# Patient Record
Sex: Female | Born: 1970 | Race: White | Hispanic: No | Marital: Single | Smoking: Current every day smoker
Health system: Southern US, Community
[De-identification: ages and names within clinical notes are randomized; demographics above are authoritative.]

## PROBLEM LIST (undated history)

## (undated) HISTORY — PX: CHOLECYSTECTOMY: SHX55

---

## 1998-06-30 ENCOUNTER — Ambulatory Visit (HOSPITAL_COMMUNITY): Admission: RE | Admit: 1998-06-30 | Discharge: 1998-06-30 | Payer: Self-pay | Admitting: *Deleted

## 1998-10-07 ENCOUNTER — Ambulatory Visit (HOSPITAL_COMMUNITY): Admission: RE | Admit: 1998-10-07 | Discharge: 1998-10-07 | Payer: Self-pay | Admitting: Obstetrics

## 1998-10-13 ENCOUNTER — Inpatient Hospital Stay (HOSPITAL_COMMUNITY): Admission: AD | Admit: 1998-10-13 | Discharge: 1998-10-15 | Payer: Self-pay | Admitting: *Deleted

## 2000-02-04 ENCOUNTER — Inpatient Hospital Stay (HOSPITAL_COMMUNITY): Admission: AD | Admit: 2000-02-04 | Discharge: 2000-02-04 | Payer: Self-pay | Admitting: *Deleted

## 2000-09-14 ENCOUNTER — Encounter: Payer: Self-pay | Admitting: *Deleted

## 2000-09-14 ENCOUNTER — Ambulatory Visit (HOSPITAL_COMMUNITY): Admission: RE | Admit: 2000-09-14 | Discharge: 2000-09-14 | Payer: Self-pay | Admitting: *Deleted

## 2000-10-07 ENCOUNTER — Inpatient Hospital Stay (HOSPITAL_COMMUNITY): Admission: AD | Admit: 2000-10-07 | Discharge: 2000-10-10 | Payer: Self-pay | Admitting: *Deleted

## 2000-12-18 ENCOUNTER — Inpatient Hospital Stay (HOSPITAL_COMMUNITY): Admission: AD | Admit: 2000-12-18 | Discharge: 2000-12-23 | Payer: Self-pay | Admitting: *Deleted

## 2000-12-19 ENCOUNTER — Encounter: Payer: Self-pay | Admitting: Obstetrics

## 2001-12-26 ENCOUNTER — Encounter: Payer: Self-pay | Admitting: Emergency Medicine

## 2001-12-26 ENCOUNTER — Emergency Department (HOSPITAL_COMMUNITY): Admission: EM | Admit: 2001-12-26 | Discharge: 2001-12-26 | Payer: Self-pay | Admitting: Emergency Medicine

## 2001-12-31 ENCOUNTER — Emergency Department (HOSPITAL_COMMUNITY): Admission: EM | Admit: 2001-12-31 | Discharge: 2001-12-31 | Payer: Self-pay | Admitting: Emergency Medicine

## 2002-01-23 ENCOUNTER — Encounter: Payer: Self-pay | Admitting: *Deleted

## 2002-01-23 ENCOUNTER — Encounter: Admission: RE | Admit: 2002-01-23 | Discharge: 2002-01-23 | Payer: Self-pay | Admitting: *Deleted

## 2002-01-28 ENCOUNTER — Ambulatory Visit (HOSPITAL_COMMUNITY): Admission: RE | Admit: 2002-01-28 | Discharge: 2002-01-29 | Payer: Self-pay | Admitting: *Deleted

## 2002-02-04 ENCOUNTER — Encounter: Payer: Self-pay | Admitting: Emergency Medicine

## 2002-02-04 ENCOUNTER — Inpatient Hospital Stay (HOSPITAL_COMMUNITY): Admission: EM | Admit: 2002-02-04 | Discharge: 2002-02-07 | Payer: Self-pay | Admitting: Emergency Medicine

## 2002-02-04 ENCOUNTER — Encounter: Payer: Self-pay | Admitting: General Surgery

## 2002-02-05 ENCOUNTER — Encounter: Payer: Self-pay | Admitting: Internal Medicine

## 2002-02-09 ENCOUNTER — Inpatient Hospital Stay (HOSPITAL_COMMUNITY): Admission: EM | Admit: 2002-02-09 | Discharge: 2002-02-12 | Payer: Self-pay | Admitting: *Deleted

## 2002-03-28 ENCOUNTER — Encounter: Payer: Self-pay | Admitting: Internal Medicine

## 2002-03-28 ENCOUNTER — Ambulatory Visit (HOSPITAL_COMMUNITY): Admission: RE | Admit: 2002-03-28 | Discharge: 2002-03-28 | Payer: Self-pay | Admitting: Internal Medicine

## 2003-01-24 ENCOUNTER — Emergency Department (HOSPITAL_COMMUNITY): Admission: EM | Admit: 2003-01-24 | Discharge: 2003-01-24 | Payer: Self-pay | Admitting: Emergency Medicine

## 2003-01-25 ENCOUNTER — Emergency Department (HOSPITAL_COMMUNITY): Admission: EM | Admit: 2003-01-25 | Discharge: 2003-01-25 | Payer: Self-pay | Admitting: Emergency Medicine

## 2004-12-11 ENCOUNTER — Emergency Department (HOSPITAL_COMMUNITY): Admission: EM | Admit: 2004-12-11 | Discharge: 2004-12-11 | Payer: Self-pay | Admitting: Emergency Medicine

## 2005-02-19 ENCOUNTER — Emergency Department (HOSPITAL_COMMUNITY): Admission: EM | Admit: 2005-02-19 | Discharge: 2005-02-19 | Payer: Self-pay | Admitting: Emergency Medicine

## 2006-09-25 ENCOUNTER — Emergency Department (HOSPITAL_COMMUNITY): Admission: EM | Admit: 2006-09-25 | Discharge: 2006-09-25 | Payer: Self-pay | Admitting: Emergency Medicine

## 2014-07-14 ENCOUNTER — Emergency Department (HOSPITAL_COMMUNITY)
Admission: EM | Admit: 2014-07-14 | Discharge: 2014-07-15 | Disposition: A | Discharge status: Home / Self Care | Payer: Self-pay | Attending: Emergency Medicine | Admitting: Emergency Medicine

## 2014-07-14 ENCOUNTER — Emergency Department (HOSPITAL_COMMUNITY): Payer: Self-pay

## 2014-07-14 ENCOUNTER — Other Ambulatory Visit: Payer: Self-pay

## 2014-07-14 ENCOUNTER — Encounter (HOSPITAL_COMMUNITY): Payer: Self-pay | Admitting: Emergency Medicine

## 2014-07-14 DIAGNOSIS — R0602 Shortness of breath: Secondary | ICD-10-CM | POA: Insufficient documentation

## 2014-07-14 DIAGNOSIS — Z720 Tobacco use: Secondary | ICD-10-CM | POA: Insufficient documentation

## 2014-07-14 DIAGNOSIS — R42 Dizziness and giddiness: Secondary | ICD-10-CM | POA: Insufficient documentation

## 2014-07-14 DIAGNOSIS — R079 Chest pain, unspecified: Secondary | ICD-10-CM | POA: Insufficient documentation

## 2014-07-14 DIAGNOSIS — Z7982 Long term (current) use of aspirin: Secondary | ICD-10-CM | POA: Insufficient documentation

## 2014-07-14 DIAGNOSIS — R531 Weakness: Secondary | ICD-10-CM | POA: Insufficient documentation

## 2014-07-14 LAB — COMPREHENSIVE METABOLIC PANEL
ALT: 15 U/L (ref 0–35)
ANION GAP: 9 (ref 5–15)
AST: 21 U/L (ref 0–37)
Albumin: 4.3 g/dL (ref 3.5–5.2)
Alkaline Phosphatase: 73 U/L (ref 39–117)
BILIRUBIN TOTAL: 0.1 mg/dL — AB (ref 0.3–1.2)
BUN: 11 mg/dL (ref 6–23)
CHLORIDE: 103 mmol/L (ref 96–112)
CO2: 25 mmol/L (ref 19–32)
CREATININE: 0.81 mg/dL (ref 0.50–1.10)
Calcium: 9.7 mg/dL (ref 8.4–10.5)
GFR calc Af Amer: >90 mL/min (ref >90)
GFR calc non Af Amer: 89 mL/min — ABNORMAL LOW (ref >90)
Glucose, Bld: 93 mg/dL (ref 70–99)
Potassium: 4.3 mmol/L (ref 3.5–5.1)
SODIUM: 137 mmol/L (ref 135–145)
Total Protein: 8.1 g/dL (ref 6.0–8.3)

## 2014-07-14 LAB — CBC
HCT: 34.1 % — ABNORMAL LOW (ref 36.0–46.0)
Hemoglobin: 11.2 g/dL — ABNORMAL LOW (ref 12.0–15.0)
MCH: 26.8 pg (ref 26.0–34.0)
MCHC: 32.8 g/dL (ref 30.0–36.0)
MCV: 81.6 fL (ref 78.0–100.0)
Platelets: 362 10*3/uL (ref 150–400)
RBC: 4.18 MIL/uL (ref 3.87–5.11)
RDW: 14.6 % (ref 11.5–15.5)
WBC: 7.1 10*3/uL (ref 4.0–10.5)

## 2014-07-14 LAB — I-STAT TROPONIN, ED
TROPONIN I, POC: 0 ng/mL (ref 0.00–0.08)
Troponin i, poc: 0 ng/mL (ref 0.00–0.08)

## 2014-07-14 LAB — LIPASE, BLOOD: Lipase: 31 U/L (ref 11–59)

## 2014-07-14 NOTE — ED Notes (Signed)
Pt states the chest pain has resolved.

## 2014-07-14 NOTE — ED Provider Notes (Signed)
CSN: [id number]     Arrival date & time 07/14/14  1840 History   First MD Initiated Contact with Patient 07/14/14 2151     Chief Complaint  Patient presents with  . Chest Pain     (Consider location/radiation/quality/duration/timing/severity/associated sxs/prior Treatment) HPI Karen Howell is a 44 y.o. female with no medical problems presents to emergency department complaining of chest pain. Patient states she was napping, woke up this afternoon around 2:30 PM with severe chest tightness. She states her pain was "tightness and some sharp pain like someone stabbing me in the chest." She states pain radiated to the left shoulder and is having pain and cramping sensation in the left trapezius. Patient does admit that the trapezius pain has been there for several days. She admits to associated shortness of breath which lasts about an hour, admits to dizziness. States also had some nausea. Denies vomiting. States she has never had similar pain in the past. She states she has had panic attacks in the past but states this felt different. She called in urgent care but was told that she needs to come to emergency department if she is having pain. Patient states her pain resolved in the waiting room. She no longer is having any chest pain. She still having pain in her left shoulder which is worse with movement and palpation of her trapezius muscle. Patient states she took aspirin at home prior to coming in. She denies any history of hypertension, diabetes, high cholesterol. She denies any immediate family history of cardiac problems. He denies any cocaine use. No cough, congestion, injuries.   History reviewed. No pertinent past medical history. Past Surgical History  Procedure Laterality Date  . Cholecystectomy     No family history on file. History  Substance Use Topics  . Smoking status: Current Every Day Smoker  . Smokeless tobacco: Not on file  . Alcohol Use: No   OB History    No data  available     Review of Systems  Constitutional: Negative for fever and chills.  Respiratory: Positive for chest tightness and shortness of breath. Negative for cough.   Cardiovascular: Positive for chest pain. Negative for palpitations and leg swelling.  Gastrointestinal: Negative for nausea, vomiting, abdominal pain and diarrhea.  Genitourinary: Negative for dysuria.  Musculoskeletal: Negative for myalgias, arthralgias, neck pain and neck stiffness.  Skin: Negative for rash.  Neurological: Positive for dizziness, weakness and light-headedness. Negative for headaches.  All other systems reviewed and are negative.     Allergies  Review of patient's allergies indicates no known allergies.  Home Medications   Prior to Admission medications   Medication Sig Start Date End Date Taking? Authorizing Provider  acetaminophen (TYLENOL) 500 MG tablet Take 500 mg by mouth every 6 (six) hours as needed for mild pain.   Yes Historical Provider, MD  aspirin 325 MG tablet Take 325 mg by mouth every 4 (four) hours as needed for mild pain.   Yes Historical Provider, MD  LORazepam (ATIVAN) 0.5 MG tablet Take 0.5 mg by mouth every 6 (six) hours as needed for anxiety.   Yes Historical Provider, MD   BP 103/47 mmHg  Pulse 58  Temp(Src) 98.4 F (36.9 C) (Oral)  Resp 22  Ht 5\' 8"  (1.727 m)  Wt 132 lb (59.875 kg)  BMI 20.08 kg/m2  SpO2 100%  LMP 06/29/2014 Physical Exam  Constitutional: She is oriented to person, place, and time. She appears well-developed and well-nourished. No distress.  HENT:  Head: Normocephalic.  Eyes: Conjunctivae are normal.  Neck: Neck supple.  Left trapezius tenderness  Cardiovascular: Normal rate, regular rhythm and normal heart sounds.   Pulmonary/Chest: Effort normal and breath sounds normal. No respiratory distress. She has no wheezes. She has no rales. She exhibits tenderness.  Tender over lower sternum and epigastric area  Abdominal: Soft. Bowel sounds are  normal. She exhibits no distension. There is no tenderness. There is no rebound.  Musculoskeletal: She exhibits no edema.  Neurological: She is alert and oriented to person, place, and time. No cranial nerve deficit. Coordination normal.  Skin: Skin is warm and dry.  Psychiatric: She has a normal mood and affect. Her behavior is normal.  Nursing note and vitals reviewed.   ED Course  Procedures (including critical care time) Labs Review Labs Reviewed  CBC - Abnormal; Notable for the following:    Hemoglobin 11.2 (*)    HCT 34.1 (*)    All other components within normal limits  COMPREHENSIVE METABOLIC PANEL - Abnormal; Notable for the following:    Total Bilirubin 0.1 (*)    GFR calc non Af Amer 89 (*)    All other components within normal limits  LIPASE, BLOOD  I-STAT TROPOININ, ED  I-STAT TROPOININ, ED    Imaging Review Dg Chest 2 View  07/14/2014   CLINICAL DATA:  Left-sided chest pain for 2 days.  Smoker.  EXAM: CHEST  2 VIEW  COMPARISON:  None.  FINDINGS: Mild hyperinflation. The heart size and mediastinal contours are within normal limits. Both lungs are clear. The visualized skeletal structures are unremarkable.  IMPRESSION: No active cardiopulmonary disease.   Electronically Signed   By: Burman Nieves M.D.   On: 07/14/2014 19:15     EKG Interpretation None      MDM   Final diagnoses:  Chest pain, unspecified chest pain type    Pt with sudden onset of chest pain and left shoulder pain while taking a nap. Pt has no risk factors for CAD. She has no cardiac hx. No risk factors for PE. VS normal. Will check labs, CXR.   12:04 AM Labs unremarkable. Delta trop normal. Pt now admits to drinking dr. Reino Kent just before napping and states sometimes it can give her GERD. She is still symptom free. Home with outpatient follow up. Return precautions discussed. Will add flexeril.   Filed Vitals:   07/14/14 2200 07/14/14 2215 07/14/14 2230 07/14/14 2245  BP: 103/47 90/42  91/49 97/60  Pulse: 58 61 60 59  Temp:      TempSrc:      Resp: Height:      Weight:      SpO2: 100% 99% 98% 100%       Lottie Mussel, PA-C 07/15/14 0024  Mirian Mo, MD 07/17/14 0021

## 2014-07-14 NOTE — ED Notes (Signed)
Pt reports waking up with chest tightness this morning, denies SOB or NV.  Pt also c/o of bilateral shoulder and neck pain, denies injury.  Respirations e/u, no acute distress noted.

## 2014-07-15 MED ORDER — CYCLOBENZAPRINE HCL 10 MG PO TABS: 10.0000 mg | ORAL_TABLET | Freq: Two times a day (BID) | ORAL | Status: DC | PRN
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
## 2014-07-15 NOTE — Discharge Instructions (Signed)
Flexeril for spasms. Lab work looks normal today. Follow up with your doctor or urgent care for further evaluation and treatment.    Chest Pain (Nonspecific) It is often hard to give a specific diagnosis for the cause of chest pain. There is always a chance that your pain could be related to something serious, such as a heart attack or a blood clot in the lungs. You need to follow up with your health care provider for further evaluation. CAUSES   Heartburn.  Pneumonia or bronchitis.  Anxiety or stress.  Inflammation around your heart (pericarditis) or lung (pleuritis or pleurisy).  A blood clot in the lung.  A collapsed lung (pneumothorax). It can develop suddenly on its own (spontaneous pneumothorax) or from trauma to the chest.  Shingles infection (herpes zoster virus). The chest wall is composed of bones, muscles, and cartilage. Any of these can be the source of the pain.  The bones can be bruised by injury.  The muscles or cartilage can be strained by coughing or overwork.  The cartilage can be affected by inflammation and become sore (costochondritis). DIAGNOSIS  Lab tests or other studies may be needed to find the cause of your pain. Your health care provider may have you take a test called an ambulatory electrocardiogram (ECG). An ECG records your heartbeat patterns over a 24-hour period. You may also have other tests, such as:  Transthoracic echocardiogram (TTE). During echocardiography, sound waves are used to evaluate how blood flows through your heart.  Transesophageal echocardiogram (TEE).  Cardiac monitoring. This allows your health care provider to monitor your heart rate and rhythm in real time.  Holter monitor. This is a portable device that records your heartbeat and can help diagnose heart arrhythmias. It allows your health care provider to track your heart activity for several days, if needed.  Stress tests by exercise or by giving medicine that makes the  heart beat faster. TREATMENT   Treatment depends on what may be causing your chest pain. Treatment may include:  Acid blockers for heartburn.  Anti-inflammatory medicine.  Pain medicine for inflammatory conditions.  Antibiotics if an infection is present.  You may be advised to change lifestyle habits. This includes stopping smoking and avoiding alcohol, caffeine, and chocolate.  You may be advised to keep your head raised (elevated) when sleeping. This reduces the chance of acid going backward from your stomach into your esophagus. Most of the time, nonspecific chest pain will improve within 2-3 days with rest and mild pain medicine.  HOME CARE INSTRUCTIONS   If antibiotics were prescribed, take them as directed. Finish them even if you start to feel better.  For the next few days, avoid physical activities that bring on chest pain. Continue physical activities as directed.  Do not use any tobacco products, including cigarettes, chewing tobacco, or electronic cigarettes.  Avoid drinking alcohol.  Only take medicine as directed by your health care provider.  Follow your health care provider's suggestions for further testing if your chest pain does not go away.  Keep any follow-up appointments you made. If you do not go to an appointment, you could develop lasting (chronic) problems with pain. If there is any problem keeping an appointment, call to reschedule. SEEK MEDICAL CARE IF:   Your chest pain does not go away, even after treatment.  You have a rash with blisters on your chest.  You have a fever. SEEK IMMEDIATE MEDICAL CARE IF:   You have increased chest pain or pain that  spreads to your arm, neck, jaw, back, or abdomen.  You have shortness of breath.  You have an increasing cough, or you cough up blood.  You have severe back or abdominal pain.  You feel nauseous or vomit.  You have severe weakness.  You faint.  You have chills. This is an emergency. Do  not wait to see if the pain will go away. Get medical help at once. Call your local emergency services (911 in [place] Do not drive yourself to the hospital. MAKE SURE YOU:   Understand these instructions.  Will watch your condition.  Will get help right away if you are not doing well or get worse. Document Released: 03/02/2005 Document Revised: 05/28/2013 Document Reviewed: 12/27/2007 [facility] Patient Information 2015 [place], [place]. This information is not intended to replace advice given to you by your health care provider. Make sure you discuss any questions you have with your health care provider.

## 2014-08-10 ENCOUNTER — Emergency Department (HOSPITAL_COMMUNITY): Payer: No Typology Code available for payment source

## 2014-08-10 ENCOUNTER — Emergency Department (HOSPITAL_COMMUNITY)
Admission: EM | Admit: 2014-08-10 | Discharge: 2014-08-11 | Disposition: A | Discharge status: Home / Self Care | Payer: No Typology Code available for payment source | Attending: Emergency Medicine | Admitting: Emergency Medicine

## 2014-08-10 ENCOUNTER — Encounter (HOSPITAL_COMMUNITY): Payer: Self-pay | Admitting: Emergency Medicine

## 2014-08-10 DIAGNOSIS — S0990XA Unspecified injury of head, initial encounter: Secondary | ICD-10-CM | POA: Diagnosis not present

## 2014-08-10 DIAGNOSIS — Y998 Other external cause status: Secondary | ICD-10-CM | POA: Insufficient documentation

## 2014-08-10 DIAGNOSIS — Z720 Tobacco use: Secondary | ICD-10-CM | POA: Insufficient documentation

## 2014-08-10 DIAGNOSIS — S199XXA Unspecified injury of neck, initial encounter: Secondary | ICD-10-CM | POA: Diagnosis present

## 2014-08-10 DIAGNOSIS — S134XXA Sprain of ligaments of cervical spine, initial encounter: Secondary | ICD-10-CM | POA: Diagnosis not present

## 2014-08-10 DIAGNOSIS — Z79899 Other long term (current) drug therapy: Secondary | ICD-10-CM | POA: Diagnosis not present

## 2014-08-10 DIAGNOSIS — Z7982 Long term (current) use of aspirin: Secondary | ICD-10-CM | POA: Diagnosis not present

## 2014-08-10 DIAGNOSIS — Y92410 Unspecified street and highway as the place of occurrence of the external cause: Secondary | ICD-10-CM | POA: Insufficient documentation

## 2014-08-10 DIAGNOSIS — R52 Pain, unspecified: Secondary | ICD-10-CM

## 2014-08-10 DIAGNOSIS — Y9389 Activity, other specified: Secondary | ICD-10-CM | POA: Diagnosis not present

## 2014-08-10 DIAGNOSIS — S139XXA Sprain of joints and ligaments of unspecified parts of neck, initial encounter: Secondary | ICD-10-CM

## 2014-08-10 MED ORDER — CYCLOBENZAPRINE HCL 10 MG PO TABS
5.0000 mg | ORAL_TABLET | Freq: Once | ORAL | Status: AC
  Administered 2014-08-10: 5 mg via ORAL
  Filled 2014-08-10: qty 1

## 2014-08-10 MED ORDER — OXYCODONE HCL 5 MG PO TABS
5.0000 mg | ORAL_TABLET | ORAL | Status: AC
  Administered 2014-08-10: 5 mg via ORAL
  Filled 2014-08-10: qty 1
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
## 2014-08-11 ENCOUNTER — Emergency Department (HOSPITAL_COMMUNITY): Payer: No Typology Code available for payment source

## 2014-08-11 ENCOUNTER — Emergency Department (HOSPITAL_COMMUNITY): Payer: Self-pay

## 2014-08-11 ENCOUNTER — Emergency Department (HOSPITAL_COMMUNITY)
Admission: EM | Admit: 2014-08-11 | Discharge: 2014-08-12 | Disposition: A | Discharge status: Home / Self Care | Payer: Self-pay | Attending: Emergency Medicine | Admitting: Emergency Medicine

## 2014-08-11 ENCOUNTER — Encounter (HOSPITAL_COMMUNITY): Payer: Self-pay | Admitting: *Deleted

## 2014-08-11 ENCOUNTER — Ambulatory Visit (HOSPITAL_COMMUNITY)
Admission: RE | Admit: 2014-08-11 | Discharge: 2014-08-11 | Disposition: A | Discharge status: Home / Self Care | Payer: No Typology Code available for payment source | Source: Ambulatory Visit | Attending: Emergency Medicine | Admitting: Emergency Medicine

## 2014-08-11 DIAGNOSIS — M542 Cervicalgia: Secondary | ICD-10-CM | POA: Insufficient documentation

## 2014-08-11 DIAGNOSIS — Z7982 Long term (current) use of aspirin: Secondary | ICD-10-CM | POA: Insufficient documentation

## 2014-08-11 DIAGNOSIS — Z79899 Other long term (current) drug therapy: Secondary | ICD-10-CM | POA: Insufficient documentation

## 2014-08-11 DIAGNOSIS — I7774 Dissection of vertebral artery: Secondary | ICD-10-CM | POA: Insufficient documentation

## 2014-08-11 DIAGNOSIS — Z720 Tobacco use: Secondary | ICD-10-CM | POA: Insufficient documentation

## 2014-08-11 LAB — I-STAT CHEM 8, ED
BUN: 12 mg/dL (ref 6–23)
Calcium, Ion: 1.23 mmol/L (ref 1.12–1.23)
Chloride: 105 mmol/L (ref 96–112)
Creatinine, Ser: 0.9 mg/dL (ref 0.50–1.10)
Glucose, Bld: 87 mg/dL (ref 70–99)
HEMATOCRIT: 38 % (ref 36.0–46.0)
Hemoglobin: 12.9 g/dL (ref 12.0–15.0)
POTASSIUM: 4 mmol/L (ref 3.5–5.1)
Sodium: 140 mmol/L (ref 135–145)
TCO2: 22 mmol/L (ref 0–100)

## 2014-08-11 MED ORDER — IOHEXOL 350 MG/ML SOLN
100.0000 mL | Freq: Once | INTRAVENOUS | Status: AC | PRN
  Administered 2014-08-11: 100 mL via INTRAVENOUS

## 2014-08-11 MED ORDER — DIPHENHYDRAMINE HCL 50 MG/ML IJ SOLN
25.0000 mg | Freq: Once | INTRAMUSCULAR | Status: DC
  Filled 2014-08-11: qty 1

## 2014-08-11 MED ORDER — OXYCODONE HCL 5 MG PO TABS: 5.0000 mg | ORAL_TABLET | ORAL | Status: AC | PRN

## 2014-08-11 NOTE — Discharge Instructions (Signed)
Cervical Collar A cervical collar is used to hold the head and neck still. This may be a soft, thick collar or a more rigid hard collar. This can keep your sore neck muscles from hurting. The collar also protects you in case a more serious injury of the neck is present and can not be seen yet on an x-ray. FOLLOW THESE INSTRUCTIONS FOR COLLAR USE:  Attach the Velcro tab in back.  Make it tight enough to support part of the weight of your chin.  Do not make it so tight that it hurts or makes it hard to breathe.  Wear it until you are comfortable without it or as instructed. HOME CARE INSTRUCTIONS   Ice packs to the neck or areas of pain approximately 03-04 times a day for 15-20 minutes while awake. Do this for 2 days. Ask your physician if you may remove the cervical collar for bathing or ice.  Do not remove any collar unless instructed by a caregiver. Ask if the collar may be removed for showering or eating.  Only take over-the-counter or prescription medicines for pain, discomfort, or fever as directed by your caregiver.  Do not drive a car until given permission by your caregiver.  Follow all instructions for follow-up with your caregiver. This includes any referrals, physical therapy, and rehabilitation. Any delay in obtaining necessary care could result in a delay or failure of the injury to heal properly. SEEK IMMEDIATE MEDICAL CARE IF:   You develop increasing pain.  You develop problems using your arms or legs or have tingling or other funny feelings in them.  You develop loss of strength in either of your arms or legs or have difficulty walking.  You develop a fever or shaking chills.  You lose control of your bowels or bladder. MAKE SURE YOU:   Understand these instructions.  Will watch your condition.  Will get help right away if you are not doing well or get worse. Document Released: 02/13/2004 Document Revised: 08/15/2011 Document Reviewed: 01/14/2008 [facility]  Patient Information 2015 [place], [place]. This information is not intended to replace advice given to you by your health care provider. Make sure you discuss any questions you have with your health care provider. You have an injury to the ligaments of your neck.  This needs to be further evaluated with the MRI that has been ordered for tomorrow.  If you do not hear from radiology.  By 10 AM please call them also been referred to Dr. Yetta BarreJones, neurosurgery, please call tomorrow to make an appointment. You've been placed in a Aspen collar to help support her neck.  This is to be worn at all times except while bathing.  If you develop new or worsening symptoms such as numbness and tingling or loss of function of an extremity.  Please return immediately to the emergency department for evaluation

## 2014-08-11 NOTE — ED Provider Notes (Signed)
Called by radiology regarding MRI finding.  Cannot exclude a vertebral artery dissection.  Ct angio of the neck was recommended.  We will contact patient and ask her to return to have that study.  Linwood DibblesJon Alisan Dokes, MD 08/11/14 2016

## 2014-08-11 NOTE — ED Notes (Signed)
Pt reports she received a call tonight stating she needs to come to the ED d/t an abnormal MRI that was done earlier today.  Pt reports neck pain.

## 2014-08-12 MED ORDER — METHOCARBAMOL 750 MG PO TABS: 750.0000 mg | ORAL_TABLET | Freq: Four times a day (QID) | ORAL | Status: AC | PRN

## 2014-08-12 MED ORDER — ASPIRIN 81 MG PO CHEW: 324.0000 mg | CHEWABLE_TABLET | Freq: Every day | ORAL | Status: AC

## 2014-08-12 MED ORDER — ASPIRIN EC 325 MG PO TBEC
325.0000 mg | DELAYED_RELEASE_TABLET | Freq: Every day | ORAL | Status: DC
  Administered 2014-08-12: 325 mg via ORAL
  Filled 2014-08-12: qty 1

## 2014-08-12 NOTE — Discharge Instructions (Signed)
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  It is recommended that you take a daily aspirin for this condition.  Follow-up with your primary care doctor.  Return to the emergency department for worsening condition or new concerning symptoms.    Cervical Sprain A cervical sprain is an injury in the neck in which the strong, fibrous tissues (ligaments) that connect your neck bones stretch or tear. Cervical sprains can range from mild to severe. Severe cervical sprains can cause the neck vertebrae to be unstable. This can lead to damage of the spinal cord and can result in serious nervous system problems. The amount of time it takes for a cervical sprain to get better depends on the cause and extent of the injury. Most cervical sprains heal in 1 to 3 weeks. CAUSES  Severe cervical sprains may be caused by:   Contact sport injuries (such as from football, rugby, wrestling, hockey, auto racing, gymnastics, diving, martial arts, or boxing).   Motor vehicle collisions.   Whiplash injuries. This is an injury from a sudden forward and backward whipping movement of the head and neck.  Falls.  Mild cervical sprains may be caused by:   Being in an awkward position, such as while cradling a telephone between your ear and shoulder.   Sitting in a chair that does not offer proper support.   Working at a poorly Marketing executivedesigned computer station.   Looking up or down for long periods of time.  SYMPTOMS   Pain, soreness, stiffness, or a burning sensation in the front, back, or sides of the neck. This discomfort may develop immediately after the injury or slowly, 24 hours or more after the injury.   Pain or tenderness directly in the middle of the back of the neck.   Shoulder or upper back pain.   Limited ability to move the neck.   Headache.   Dizziness.   Weakness, numbness, or tingling in the hands or arms.    Muscle spasms.   Difficulty swallowing or chewing.   Tenderness and swelling of the neck.  DIAGNOSIS  Most of the time your health care provider can diagnose a cervical sprain by taking your history and doing a physical exam. Your health care provider will ask about previous neck injuries and any known neck problems, such as arthritis in the neck. X-rays may be taken to find out if there are any other problems, such as with the bones of the neck. Other tests, such as a CT scan or MRI, may also be needed.  TREATMENT  Treatment depends on the severity of the cervical sprain. Mild sprains can be treated with rest, keeping the neck in place (immobilization), and pain medicines. Severe cervical sprains are immediately immobilized. Further treatment is done to help with pain, muscle spasms, and other symptoms and may include:  Medicines, such as pain relievers, numbing medicines, or muscle relaxants.   Physical therapy. This may involve stretching exercises, strengthening exercises, and posture training. Exercises and improved posture can help stabilize the neck, strengthen muscles, and help stop symptoms from returning.  HOME CARE INSTRUCTIONS   Put ice on the injured area.   Put ice in a plastic bag.   Place a towel between your skin and the bag.   Leave the ice on for 15-20 minutes, 3-4 times a day.   If your injury was severe, you may have been given a cervical collar to wear.  A cervical collar is a two-piece collar designed to keep your neck from moving while it heals.  Do not remove the collar unless instructed by your health care provider.  If you have long hair, keep it outside of the collar.  Ask your health care provider before making any adjustments to your collar. Minor adjustments may be required over time to improve comfort and reduce pressure on your chin or on the back of your head.  Ifyou are allowed to remove the collar for cleaning or bathing, follow your  health care provider's instructions on how to do so safely.  Keep your collar clean by wiping it with mild soap and water and drying it completely. If the collar you have been given includes removable pads, remove them every 1-2 days and hand wash them with soap and water. Allow them to air dry. They should be completely dry before you wear them in the collar.  If you are allowed to remove the collar for cleaning and bathing, wash and dry the skin of your neck. Check your skin for irritation or sores. If you see any, tell your health care provider.  Do not drive while wearing the collar.   Only take over-the-counter or prescription medicines for pain, discomfort, or fever as directed by your health care provider.   Keep all follow-up appointments as directed by your health care provider.   Keep all physical therapy appointments as directed by your health care provider.   Make any needed adjustments to your workstation to promote good posture.   Avoid positions and activities that make your symptoms worse.   Warm up and stretch before being active to help prevent problems.  SEEK MEDICAL CARE IF:   Your pain is not controlled with medicine.   You are unable to decrease your pain medicine over time as planned.   Your activity level is not improving as expected.  SEEK IMMEDIATE MEDICAL CARE IF:   You develop any bleeding.  You develop stomach upset.  You have signs of an allergic reaction to your medicine.   Your symptoms get worse.   You develop new, unexplained symptoms.   You have numbness, tingling, weakness, or paralysis in any part of your body.  MAKE SURE YOU:   Understand these instructions.  Will watch your condition.  Will get help right away if you are not doing well or get worse. Document Released: 03/20/2007 Document Revised: 05/28/2013 Document Reviewed: 11/28/2012 [facility] Patient Information 2015 [place], [place]. This information is not  intended to replace advice given to you by your health care provider. Make sure you discuss any questions you have with your health care provider.   Vertebral Artery Dissection The vertebral arteries are major arteries at the base of the neck. They carry blood from the heart to the brain. Vertebral artery dissection is a condition in which a tear develops in the artery wall, allowing blood to collect within the layers of the wall. The blood that collects between the layers may form a clot. This can lead to a stroke if not diagnosed and treated right away. Vertebral artery dissection occurs most often in middle-aged people. It is a common cause of stroke in people younger than 45 years. CAUSES   Injury to the head or neck. The injury may range from mild to severe.  Weak blood vessel walls. The walls may tear even when no outside injury occurs. This is called spontaneous dissection. SYMPTOMS  Symptoms usually appear within days of an  injury, but sometimes they do not appear for weeks or years. Symptoms may include:  Pain in the head, neck, eye, or face. The pain is typically stabbing, sharp, and unusual.  Difficulty speaking.  Hoarseness.  Loss of feeling in the torso, legs, or arms.  Loss of sense of taste.  Hiccups.  Vertigo.  Dizziness.  Double vision.  Nausea and vomiting.  Difficulty swallowing.  Loss of balance.  Hearing loss.  Ear pain. DIAGNOSIS  A physical exam will be performed. To diagnose this condition, your caregiver may order various tests, such as:  Computed tomography (CT).  Magnetic resonance imaging (MRI).  Magnetic resonance angiography.  Ultrasonography.  Blood tests. TREATMENT  Immediate treatment is often required to reduce the risk of stroke. Vertebral artery dissection is usually treated with medicines that prevent blood from clotting (anticoagulant and antiplatelet medicines). In some cases, surgery is done to repair the tear in the blood  vessel. HOME CARE INSTRUCTIONS  Only take over-the-counter or prescription medicines as directed by your caregiver. Take all medicines exactly as instructed.  Maintain a healthy weight.  Get regular exercise. Check with your caregiver before starting any new type of exercise.  Do not smoke.  Follow up with your caregiver as directed. SEEK MEDICAL CARE IF:  Your symptoms are not getting better. SEEK IMMEDIATE MEDICAL CARE IF:  You feel weak or dizzy.  You notice changes in your vision or speech.  You have trouble breathing.  You have a sudden, severe headache with no known cause.  You have a loss of balance or coordination.  You have numbness in your face, arm, or leg. Any of these symptoms may represent a serious problem that is an emergency. Do not wait to see if the symptoms will go away. Get medical help right away. Call your local emergency services (911 in [place] Do not drive yourself to the hospital. MAKE SURE YOU:  Understand these instructions.  Will watch your condition.  Will get help right away if you are not doing well or get worse. Document Released: 05/09/2012 Document Reviewed: 05/09/2012 [facility] Patient Information 2015 [place], [place]. This information is not intended to replace advice given to you by your health care provider. Make sure you discuss any questions you have with your health care provider.
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
## 2017-03-22 ENCOUNTER — Emergency Department (HOSPITAL_COMMUNITY): Payer: Medicaid Other

## 2017-03-22 ENCOUNTER — Emergency Department (HOSPITAL_COMMUNITY)
Admission: EM | Admit: 2017-03-22 | Discharge: 2017-03-22 | Disposition: A | Discharge status: Home / Self Care | Payer: Medicaid Other | Attending: Emergency Medicine | Admitting: Emergency Medicine

## 2017-03-22 DIAGNOSIS — Z7982 Long term (current) use of aspirin: Secondary | ICD-10-CM | POA: Diagnosis not present

## 2017-03-22 DIAGNOSIS — J069 Acute upper respiratory infection, unspecified: Secondary | ICD-10-CM | POA: Insufficient documentation

## 2017-03-22 DIAGNOSIS — Z79899 Other long term (current) drug therapy: Secondary | ICD-10-CM | POA: Diagnosis not present

## 2017-03-22 DIAGNOSIS — R079 Chest pain, unspecified: Secondary | ICD-10-CM | POA: Insufficient documentation

## 2017-03-22 DIAGNOSIS — B9789 Other viral agents as the cause of diseases classified elsewhere: Secondary | ICD-10-CM | POA: Insufficient documentation

## 2017-03-22 DIAGNOSIS — F17210 Nicotine dependence, cigarettes, uncomplicated: Secondary | ICD-10-CM | POA: Diagnosis not present

## 2017-03-22 LAB — CBC
HEMATOCRIT: 37 % (ref 36.0–46.0)
Hemoglobin: 11.8 g/dL — ABNORMAL LOW (ref 12.0–15.0)
MCH: 25.8 pg — ABNORMAL LOW (ref 26.0–34.0)
MCHC: 31.9 g/dL (ref 30.0–36.0)
MCV: 81 fL (ref 78.0–100.0)
Platelets: 331 10*3/uL (ref 150–400)
RBC: 4.57 MIL/uL (ref 3.87–5.11)
RDW: 16.2 % — ABNORMAL HIGH (ref 11.5–15.5)
WBC: 8.2 10*3/uL (ref 4.0–10.5)

## 2017-03-22 LAB — I-STAT TROPONIN, ED: Troponin i, poc: 0 ng/mL (ref 0.00–0.08)

## 2017-03-22 LAB — BASIC METABOLIC PANEL
Anion gap: 10 (ref 5–15)
BUN: <5 mg/dL — ABNORMAL LOW (ref 6–20)
CHLORIDE: 102 mmol/L (ref 101–111)
CO2: 24 mmol/L (ref 22–32)
Calcium: 9.8 mg/dL (ref 8.9–10.3)
Creatinine, Ser: 0.74 mg/dL (ref 0.44–1.00)
GFR calc non Af Amer: >60 mL/min (ref >60)
Glucose, Bld: 113 mg/dL — ABNORMAL HIGH (ref 65–99)
POTASSIUM: 3.8 mmol/L (ref 3.5–5.1)
SODIUM: 136 mmol/L (ref 135–145)

## 2017-03-22 MED ORDER — PREDNISONE 20 MG PO TABS: 40.0000 mg | ORAL_TABLET | Freq: Every day | ORAL | 0 refills | Status: AC

## 2017-03-22 MED ORDER — AEROCHAMBER PLUS FLO-VU MEDIUM MISC
1.0000 | Freq: Once | Status: DC
  Filled 2017-03-22: qty 1

## 2017-03-22 MED ORDER — IPRATROPIUM-ALBUTEROL 0.5-2.5 (3) MG/3ML IN SOLN
3.0000 mL | Freq: Once | RESPIRATORY_TRACT | Status: DC
  Filled 2017-03-22: qty 3

## 2017-03-22 MED ORDER — ALBUTEROL SULFATE HFA 108 (90 BASE) MCG/ACT IN AERS
1.0000 | INHALATION_SPRAY | RESPIRATORY_TRACT | Status: DC | PRN
  Administered 2017-03-22: 1 via RESPIRATORY_TRACT
  Filled 2017-03-22 (×2): qty 6.7

## 2017-03-22 NOTE — Discharge Instructions (Signed)
Your chest x-ray was negative for signs of pneumonia. The blood tests looked normal. Your EKG did not show any signs of a heart attack.   Please take prednisone 40 mg for the next 3 days. We have also sent him home with an albuterol inhaler which she can use as needed for shortness of breath and wheezing.  Please schedule an appointment with her primary care provider for recheck and for follow-up of your visit in the ER today.  Please return to the emergency department if you experience leg swelling and one of your legs, worsening chest pain with shortness of breath, chest pain with nausea/vomiting, chest pain that radiates to your left jaw and down her left arm, chest pain which is worsened with breathing.

## 2017-03-22 NOTE — ED Provider Notes (Signed)
[facility] EMERGENCY DEPARTMENT Provider Note   CSN: [id number] Arrival date & time: 03/22/17  1418     History   Chief Complaint Chief Complaint  Patient presents with  . Chest Pain    HPI Karen Howell is a 46 y.o. female.  HPI   [name] is a 46yo female with a medical history of tobacco use (10pk/year) who presents the emergency department from an urgent care facility with complaint of left sided chest pain. Patient states that she developed sore throat, congestion, non-productive cough, myalgias, fatigue, tactile fever about two days ago. Thought she had the flu and treated her symptoms with rest and over the counter cold medicine. States that last night she developed 5/10 in severity "sharp and burning" left sided chest pain along with her other symptoms. The pain radiates to her left shoulder. She states that when she went to urgent care today, she was told to come to the ER given her left sided chest and shoulder pain. Her chest pain is currently resolved. She denies associated shortness of breath, nausea/vomiting, abdominal pain, numbness. No history of exertional chest pain. She has a maternal grandmother who died of a heart attack at age 48, no other family history of heart disease that she knows of. She denies unilateral leg swelling, no history of exogenous estrogen use, no history of cancer, no recent immobility, no recent surgeries.  No past medical history on file.  There are no active problems to display for this patient.   Past Surgical History:  Procedure Laterality Date  . CHOLECYSTECTOMY      OB History    No data available       Home Medications    Prior to Admission medications   Medication Sig Start Date End Date Taking? Authorizing Provider  acetaminophen (TYLENOL) 500 MG tablet Take 1,000 mg by mouth every 6 (six) hours as needed (for pain).    Yes [provider]  aspirin EC 325 MG tablet Take 325 mg by mouth  daily.   Yes [provider]  ibuprofen (ADVIL,MOTRIN) 200 MG tablet Take 400-600 mg by mouth every 6 (six) hours as needed (for pain).    Yes [provider]  LORazepam (ATIVAN) 0.5 MG tablet Take 0.5 mg by mouth every 6 (six) hours as needed for anxiety.   Yes [provider]  aspirin 81 MG chewable tablet Chew 4 tablets (324 mg total) by mouth daily. Patient not taking: Reported on 03/22/2017 08/12/14   Marisa Severin, MD  methocarbamol (ROBAXIN-750) 750 MG tablet Take 1 tablet (750 mg total) by mouth every 6 (six) hours as needed for muscle spasms. Patient not taking: Reported on 03/22/2017 08/12/14   Marisa Severin, MD  oxyCODONE (OXY IR/ROXICODONE) 5 MG immediate release tablet Take 1 tablet (5 mg total) by mouth every 4 (four) hours as needed for severe pain. Patient not taking: Reported on 03/22/2017 08/11/14   Earley Favor, NP  predniSONE (DELTASONE) 20 MG tablet Take 2 tablets (40 mg total) by mouth daily. 03/22/17 03/25/17  Kellie Shropshire, PA-C    Family History No family history on file.  Social History Social History  Substance Use Topics  . Smoking status: Current Every Day Smoker    Packs/day: 0.50    Types: Cigarettes  . Smokeless tobacco: Not on file  . Alcohol use No     Allergies   Omnipaque [iohexol] and Codeine   Review of Systems Review of Systems  Constitutional: Positive  for chills, fatigue and fever. Negative for diaphoresis and unexpected weight change.  HENT: Positive for congestion, postnasal drip, rhinorrhea and sore throat. Negative for ear pain, hearing loss, mouth sores, sinus pain, sinus pressure, trouble swallowing and voice change.   Eyes: Negative for visual disturbance.  Respiratory: Positive for cough. Negative for shortness of breath and wheezing.   Cardiovascular: Positive for chest pain. Negative for leg swelling.  Gastrointestinal: Negative for abdominal pain, diarrhea, nausea and vomiting.  Genitourinary: Negative for  difficulty urinating.  Musculoskeletal: Positive for arthralgias (total body) and myalgias (total body). Negative for gait problem.  Skin: Negative for rash and wound.  Neurological: Negative for dizziness, weakness, light-headedness, numbness and headaches.  Psychiatric/Behavioral: Negative for agitation.     Physical Exam Updated Vital Signs BP 119/69 (BP Location: Left Arm)   Pulse (!) 106   Temp 98 F (36.7 C) (Oral)   Resp 18   LMP 03/03/2017   SpO2 98%   Physical Exam  Constitutional: She is oriented to person, place, and time. She appears well-developed and well-nourished. No distress.  HENT:  Head: Normocephalic and atraumatic.  Mouth/Throat: Oropharynx is clear and moist. No oropharyngeal exudate.  Bilateral TMs with good cone of light. Mild erythema of the posterior oropharynx. Uvula midline. Clear rhinorrhea in the nasal cavity. No tenderness to palpation over the maxillary or frontal sinus.  Eyes: Pupils are equal, round, and reactive to light. Conjunctivae are normal. Right eye exhibits no discharge. Left eye exhibits no discharge.  Neck: Normal range of motion. Neck supple. No JVD present. No tracheal deviation present.  Cardiovascular: Normal rate, regular rhythm and intact distal pulses.  Exam reveals no friction rub.   No murmur heard. Pulmonary/Chest: Effort normal and breath sounds normal. No respiratory distress. She has no wheezes. She has no rales. She exhibits no tenderness.  Abdominal: Soft. Bowel sounds are normal. There is no tenderness. There is no rebound and no guarding.  Musculoskeletal: Normal range of motion.  No leg swellling  Lymphadenopathy:    She has no cervical adenopathy.  Neurological: She is alert and oriented to person, place, and time. Coordination normal.  Skin: Skin is warm and dry. Capillary refill takes less than 2 seconds. She is not diaphoretic.  Psychiatric: She has a normal mood and affect. Her behavior is normal.     ED  Treatments / Results  Labs (all labs ordered are listed, but only abnormal results are displayed) Labs Reviewed  BASIC METABOLIC PANEL - Abnormal; Notable for the following:       Result Value   Glucose, Bld 113 (*)    BUN <5 (*)    All other components within normal limits  CBC - Abnormal; Notable for the following:    Hemoglobin 11.8 (*)    MCH 25.8 (*)    RDW 16.2 (*)    All other components within normal limits  I-STAT TROPONIN, ED    EKG  EKG Interpretation  Date/Time:  Wednesday March 22 2017 14:25:19 EDT Ventricular Rate:  112 PR Interval:  178 QRS Duration: 102 QT Interval:  328 QTC Calculation: 447 R Axis:   91 Text Interpretation:  Sinus tachycardia Right atrial enlargement Rightward axis Pulmonary disease pattern Nonspecific ST abnormality Abnormal ECG no sig change from previous exceept rate Confirmed by Arby BarrettePfeiffer, Marcy [phone]([id number]) on 03/22/2017 6:18:06 PM       Radiology Dg Chest 2 View  Result Date: 03/22/2017 CLINICAL DATA:  Days of productive cough associated with nausea and sore  throat. No shortness of breath. Recently diagnosed with early COPD. Patient has been a smoker for the past 22 years. EXAM: CHEST  2 VIEW COMPARISON:  Chest x-ray of July 14, 2014 FINDINGS: The lungs are mildly hyperinflated with mild hemidiaphragm flattening. There is no focal infiltrate. There is no pleural effusion. The heart and pulmonary vascularity are normal. The mediastinum is normal in width. The bony thorax is unremarkable. IMPRESSION: Mild hyperinflation consistent with COPD and the smoking history. There is no pneumonia, CHF, nor other acute cardiopulmonary abnormality. Electronically Signed   By: David  [place] M.D.   On: 03/22/2017 15:52    Procedures Procedures (including critical care time)  Medications Ordered in ED Medications - No data to display   Initial Impression / Assessment and Plan / ED Course  I have reviewed the triage vital signs and the nursing  notes.  Pertinent labs & imaging results that were available during my care of the patient were reviewed by me and considered in my medical decision making (see chart for details).     Patient is to be discharged with recommendation to follow up with PCP in regards to today's hospital visit. Patient's chest pain is likely related to total body aches from recent viral illness. Chest pain is not likely of cardiac etiology d/t presentation, VSS, no tracheal deviation, no JVD or new murmur, RRR, breath sounds equal bilaterally, EKG without acute abnormalities, negative troponin, and negative CXR. Not likely pulmonary etiology given no pneumonia, pneumothorax on CXR and lungs CTA.   Patient is not PERC negative, as she is tachycardic (pulse 106). D-dimer ordered. Patient states that she does not want to stay for that test, is asking to leave without the test being done as she needs to pick up her child from school. Given this, have counseled patient extensively of return precautions including unilateral leg swelling, worsening shortness of breath with chest pain, hemoptysis.  Will send her home with short burst of steroids and albuterol inhaler. Pt has been advised to return to the ED if CP becomes exertional, associated with diaphoresis or nausea, radiates to left jaw/arm, worsens or becomes concerning in any way. Patient's glucose was mildly elevated at 113, she was made aware of this to follow up at her primary doctor's office. Pt appears reliable for follow up and is agreeable to discharge.   Case has been discussed with and seen by Dr. Donnald Garre who agrees with the above plan to discharge.   Final Clinical Impressions(s) / ED Diagnoses   Final diagnoses:  Viral URI with cough  Chest pain, unspecified type    New Prescriptions Discharge Medication List as of 03/22/2017  7:18 PM    START taking these medications   Details  predniSONE (DELTASONE) 20 MG tablet Take 2 tablets (40 mg total) by  mouth daily., Starting Wed 03/22/2017, Until Sat 03/25/2017, Print         Kellie Shropshire, PA-C 03/23/17 1610    Arby Barrette, MD 03/27/17 [phone]

## 2017-03-22 NOTE — ED Notes (Signed)
Pt left without waiting for spacer to arrive from RT.

## 2017-03-22 NOTE — ED Notes (Signed)
RT notified for spacer teaching.

## 2017-03-22 NOTE — ED Notes (Signed)
Pt verbalizes understanding of d/c instructions. Pt received prescriptions. Pt ambulatory at d/c with all belongings.  

## 2017-03-22 NOTE — ED Notes (Signed)
Pt refuses d-dimer and duoneb tx. Pt reports she has somewhere to be.

## 2017-03-22 NOTE — ED Notes (Signed)
ED Provider at bedside. 

## 2019-06-20 ENCOUNTER — Ambulatory Visit: Payer: Medicaid Other | Attending: Internal Medicine

## 2019-06-20 DIAGNOSIS — Z20822 Contact with and (suspected) exposure to covid-19: Secondary | ICD-10-CM

## 2019-06-21 LAB — NOVEL CORONAVIRUS, NAA: SARS-CoV-2, NAA: NOT DETECTED

## 2019-12-05 DIAGNOSIS — Z419 Encounter for procedure for purposes other than remedying health state, unspecified: Secondary | ICD-10-CM | POA: Diagnosis not present

## 2020-01-05 DIAGNOSIS — Z419 Encounter for procedure for purposes other than remedying health state, unspecified: Secondary | ICD-10-CM | POA: Diagnosis not present

## 2020-02-05 DIAGNOSIS — Z419 Encounter for procedure for purposes other than remedying health state, unspecified: Secondary | ICD-10-CM | POA: Diagnosis not present

## 2020-03-06 DIAGNOSIS — Z419 Encounter for procedure for purposes other than remedying health state, unspecified: Secondary | ICD-10-CM | POA: Diagnosis not present

## 2020-04-06 DIAGNOSIS — Z419 Encounter for procedure for purposes other than remedying health state, unspecified: Secondary | ICD-10-CM | POA: Diagnosis not present

## 2020-05-06 DIAGNOSIS — Z419 Encounter for procedure for purposes other than remedying health state, unspecified: Secondary | ICD-10-CM | POA: Diagnosis not present

## 2020-06-06 DIAGNOSIS — Z419 Encounter for procedure for purposes other than remedying health state, unspecified: Secondary | ICD-10-CM | POA: Diagnosis not present

## 2020-07-07 DIAGNOSIS — Z419 Encounter for procedure for purposes other than remedying health state, unspecified: Secondary | ICD-10-CM | POA: Diagnosis not present

## 2020-08-04 DIAGNOSIS — Z419 Encounter for procedure for purposes other than remedying health state, unspecified: Secondary | ICD-10-CM | POA: Diagnosis not present

## 2020-09-04 DIAGNOSIS — Z419 Encounter for procedure for purposes other than remedying health state, unspecified: Secondary | ICD-10-CM | POA: Diagnosis not present

## 2020-10-04 DIAGNOSIS — Z419 Encounter for procedure for purposes other than remedying health state, unspecified: Secondary | ICD-10-CM | POA: Diagnosis not present

## 2020-11-04 DIAGNOSIS — Z419 Encounter for procedure for purposes other than remedying health state, unspecified: Secondary | ICD-10-CM | POA: Diagnosis not present

## 2020-12-04 DIAGNOSIS — Z419 Encounter for procedure for purposes other than remedying health state, unspecified: Secondary | ICD-10-CM | POA: Diagnosis not present

## 2021-01-04 DIAGNOSIS — Z419 Encounter for procedure for purposes other than remedying health state, unspecified: Secondary | ICD-10-CM | POA: Diagnosis not present

## 2021-02-04 DIAGNOSIS — Z419 Encounter for procedure for purposes other than remedying health state, unspecified: Secondary | ICD-10-CM | POA: Diagnosis not present

## 2021-03-06 DIAGNOSIS — Z419 Encounter for procedure for purposes other than remedying health state, unspecified: Secondary | ICD-10-CM | POA: Diagnosis not present

## 2021-04-06 DIAGNOSIS — Z419 Encounter for procedure for purposes other than remedying health state, unspecified: Secondary | ICD-10-CM | POA: Diagnosis not present

## 2021-05-06 DIAGNOSIS — Z419 Encounter for procedure for purposes other than remedying health state, unspecified: Secondary | ICD-10-CM | POA: Diagnosis not present

## 2021-06-06 DIAGNOSIS — Z419 Encounter for procedure for purposes other than remedying health state, unspecified: Secondary | ICD-10-CM | POA: Diagnosis not present

## 2021-07-07 DIAGNOSIS — Z419 Encounter for procedure for purposes other than remedying health state, unspecified: Secondary | ICD-10-CM | POA: Diagnosis not present

## 2021-08-04 DIAGNOSIS — Z419 Encounter for procedure for purposes other than remedying health state, unspecified: Secondary | ICD-10-CM | POA: Diagnosis not present

## 2021-09-04 DIAGNOSIS — Z419 Encounter for procedure for purposes other than remedying health state, unspecified: Secondary | ICD-10-CM | POA: Diagnosis not present

## 2021-10-04 DIAGNOSIS — Z419 Encounter for procedure for purposes other than remedying health state, unspecified: Secondary | ICD-10-CM | POA: Diagnosis not present

## 2021-11-04 DIAGNOSIS — Z419 Encounter for procedure for purposes other than remedying health state, unspecified: Secondary | ICD-10-CM | POA: Diagnosis not present

## 2021-12-04 DIAGNOSIS — Z419 Encounter for procedure for purposes other than remedying health state, unspecified: Secondary | ICD-10-CM | POA: Diagnosis not present

## 2022-01-04 DIAGNOSIS — Z419 Encounter for procedure for purposes other than remedying health state, unspecified: Secondary | ICD-10-CM | POA: Diagnosis not present

## 2022-02-04 DIAGNOSIS — Z419 Encounter for procedure for purposes other than remedying health state, unspecified: Secondary | ICD-10-CM | POA: Diagnosis not present

## 2022-03-06 DIAGNOSIS — Z419 Encounter for procedure for purposes other than remedying health state, unspecified: Secondary | ICD-10-CM | POA: Diagnosis not present

## 2022-04-06 DIAGNOSIS — Z419 Encounter for procedure for purposes other than remedying health state, unspecified: Secondary | ICD-10-CM | POA: Diagnosis not present

## 2022-05-06 DIAGNOSIS — Z419 Encounter for procedure for purposes other than remedying health state, unspecified: Secondary | ICD-10-CM | POA: Diagnosis not present

## 2022-06-06 DIAGNOSIS — Z419 Encounter for procedure for purposes other than remedying health state, unspecified: Secondary | ICD-10-CM | POA: Diagnosis not present

## 2022-07-07 DIAGNOSIS — Z419 Encounter for procedure for purposes other than remedying health state, unspecified: Secondary | ICD-10-CM | POA: Diagnosis not present

## 2022-08-05 DIAGNOSIS — Z419 Encounter for procedure for purposes other than remedying health state, unspecified: Secondary | ICD-10-CM | POA: Diagnosis not present

## 2022-09-05 DIAGNOSIS — Z419 Encounter for procedure for purposes other than remedying health state, unspecified: Secondary | ICD-10-CM | POA: Diagnosis not present

## 2022-10-05 DIAGNOSIS — Z419 Encounter for procedure for purposes other than remedying health state, unspecified: Secondary | ICD-10-CM | POA: Diagnosis not present

## 2022-11-05 DIAGNOSIS — Z419 Encounter for procedure for purposes other than remedying health state, unspecified: Secondary | ICD-10-CM | POA: Diagnosis not present

## 2022-12-05 DIAGNOSIS — Z419 Encounter for procedure for purposes other than remedying health state, unspecified: Secondary | ICD-10-CM | POA: Diagnosis not present

## 2023-01-05 DIAGNOSIS — Z419 Encounter for procedure for purposes other than remedying health state, unspecified: Secondary | ICD-10-CM | POA: Diagnosis not present

## 2023-02-05 DIAGNOSIS — Z419 Encounter for procedure for purposes other than remedying health state, unspecified: Secondary | ICD-10-CM | POA: Diagnosis not present

## 2023-04-07 DIAGNOSIS — Z419 Encounter for procedure for purposes other than remedying health state, unspecified: Secondary | ICD-10-CM | POA: Diagnosis not present

## 2023-05-07 DIAGNOSIS — Z419 Encounter for procedure for purposes other than remedying health state, unspecified: Secondary | ICD-10-CM | POA: Diagnosis not present

## 2023-06-07 DIAGNOSIS — Z419 Encounter for procedure for purposes other than remedying health state, unspecified: Secondary | ICD-10-CM | POA: Diagnosis not present

## 2023-07-08 DIAGNOSIS — Z419 Encounter for procedure for purposes other than remedying health state, unspecified: Secondary | ICD-10-CM | POA: Diagnosis not present

## 2023-08-05 DIAGNOSIS — Z419 Encounter for procedure for purposes other than remedying health state, unspecified: Secondary | ICD-10-CM | POA: Diagnosis not present

## 2023-09-16 DIAGNOSIS — Z419 Encounter for procedure for purposes other than remedying health state, unspecified: Secondary | ICD-10-CM | POA: Diagnosis not present

## 2023-10-16 DIAGNOSIS — Z419 Encounter for procedure for purposes other than remedying health state, unspecified: Secondary | ICD-10-CM | POA: Diagnosis not present

## 2023-11-16 DIAGNOSIS — Z419 Encounter for procedure for purposes other than remedying health state, unspecified: Secondary | ICD-10-CM | POA: Diagnosis not present

## 2023-12-16 DIAGNOSIS — Z419 Encounter for procedure for purposes other than remedying health state, unspecified: Secondary | ICD-10-CM | POA: Diagnosis not present

## 2024-01-16 DIAGNOSIS — Z419 Encounter for procedure for purposes other than remedying health state, unspecified: Secondary | ICD-10-CM | POA: Diagnosis not present

## 2024-02-16 DIAGNOSIS — Z419 Encounter for procedure for purposes other than remedying health state, unspecified: Secondary | ICD-10-CM | POA: Diagnosis not present

## 2024-05-17 DIAGNOSIS — Z419 Encounter for procedure for purposes other than remedying health state, unspecified: Secondary | ICD-10-CM | POA: Diagnosis not present
